# Patient Record
Sex: Male | Born: 1980 | Race: White | Hispanic: No | Marital: Single | Smoking: Former smoker
Health system: Southern US, Community
[De-identification: ages and names within clinical notes are randomized; demographics above are authoritative.]

## PROBLEM LIST (undated history)

## (undated) ENCOUNTER — Ambulatory Visit: Admission: EM | Payer: Self-pay | Source: Home / Self Care

## (undated) DIAGNOSIS — F419 Anxiety disorder, unspecified: Secondary | ICD-10-CM

## (undated) DIAGNOSIS — F4310 Post-traumatic stress disorder, unspecified: Secondary | ICD-10-CM

## (undated) DIAGNOSIS — F988 Other specified behavioral and emotional disorders with onset usually occurring in childhood and adolescence: Secondary | ICD-10-CM

---
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
## 2004-05-12 ENCOUNTER — Emergency Department: Payer: Self-pay | Admitting: Emergency Medicine

## 2004-05-13 ENCOUNTER — Ambulatory Visit: Payer: Self-pay | Admitting: Family Medicine

## 2004-05-15 ENCOUNTER — Ambulatory Visit: Payer: Self-pay | Admitting: Family Medicine

## 2005-10-04 ENCOUNTER — Emergency Department: Payer: Self-pay | Admitting: Emergency Medicine

## 2005-10-28 ENCOUNTER — Ambulatory Visit: Payer: Self-pay | Admitting: Internal Medicine

## 2005-11-11 ENCOUNTER — Ambulatory Visit: Payer: Self-pay | Admitting: Family Medicine

## 2005-11-25 ENCOUNTER — Ambulatory Visit: Payer: Self-pay | Admitting: Family Medicine

## 2005-12-21 ENCOUNTER — Ambulatory Visit: Payer: Self-pay | Admitting: Family Medicine

## 2005-12-31 ENCOUNTER — Ambulatory Visit: Payer: Self-pay | Admitting: Family Medicine

## 2006-01-12 ENCOUNTER — Ambulatory Visit: Payer: Self-pay | Admitting: Family Medicine

## 2006-01-18 ENCOUNTER — Ambulatory Visit: Payer: Self-pay | Admitting: Family Medicine

## 2006-07-22 ENCOUNTER — Ambulatory Visit: Payer: Self-pay | Admitting: Family Medicine

## 2006-08-11 DIAGNOSIS — L259 Unspecified contact dermatitis, unspecified cause: Secondary | ICD-10-CM | POA: Insufficient documentation

## 2009-08-11 ENCOUNTER — Ambulatory Visit: Payer: Self-pay | Admitting: Family Medicine

## 2009-08-11 DIAGNOSIS — G4700 Insomnia, unspecified: Secondary | ICD-10-CM | POA: Insufficient documentation

## 2009-08-11 DIAGNOSIS — F1121 Opioid dependence, in remission: Secondary | ICD-10-CM | POA: Insufficient documentation

## 2009-12-11 ENCOUNTER — Encounter (INDEPENDENT_AMBULATORY_CARE_PROVIDER_SITE_OTHER): Payer: Self-pay | Admitting: *Deleted

## 2010-06-10 NOTE — Letter (Signed)
Summary: Nadara Eaton letter  [place] [facility]  [address] [place], [place] [address]   Phone: [phone]  Fax: [phone]       12/11/2009 MRN: [id number]  KYSER WANDEL [address] [place], [place]  [address]  Dear Mr. [facility] [place], and [place] announce the retirement of Arta Silence, M.D., from full-time practice at the [facility] office effective November 06, 2009 and his plans of returning part-time.  It is important to Dr. Hetty Ely and to our practice that you understand that [facility] has seven physicians in our office for your health care needs.  We will continue to offer the same exceptional care that you have today.    Dr. Hetty Ely has spoken to many of you about his plans for retirement and returning part-time in the fall.   We will continue to work with you through the transition to schedule appointments for you in the office and meet the high standards that [place] is committed to.   Again, it is with great pleasure that we share the news that Dr. Hetty Ely will return to [facility] in October of 2011 with a reduced schedule.    If you have any questions, or would like to request an appointment with one of our physicians, please call us at [phone] and press the option for Scheduling an appointment.  We take pleasure in providing you with excellent patient care and look forward to seeing you at your next office visit.  Our [facility] Physicians are:  Tillman Abide, M.D. Laurita Quint, M.D. Roxy Manns, M.D. Kerby Nora, M.D. Hannah Beat, M.D. Ruthe Mannan, M.D. We proudly welcomed Raechel Ache, M.D. and Eustaquio Boyden, M.D. to the practice in July/August 2011.  Sincerely,  [facility]

## 2010-06-10 NOTE — Assessment & Plan Note (Signed)
Summary: PHYSICAL/CHECK-UP   Vital Signs:  Patient profile:   30 year old male Height:      69.5 inches Weight:      182.50 pounds BMI:     26.66 Temp:     98.1 degrees F oral Pulse rate:   60 / minute Pulse rhythm:   regular BP sitting:   108 / 68  (left arm) Cuff size:   regular  Vitals Entered By: Sydell Axon LPN (August 12, 979 10:53 AM) CC: 30 Minute checkup   History of Present Illness: Pt here for Comp Exam. He is having trouble sleeping for the last 2-3 mos, wakes up a lot. It culminates in awakening in AM at 7 AM and typically has to get up even on the weekends when he would prefer to sleep later.  He admits to pressure  His best friend died 19-Jan-2024and was his roommate. He lost his home at school as well when his frien died and school was to start 2 days later and has put him behind the eightball.  He thinks he had some problem before with sleep but this situation has magnified the problem.Marland Kitchen He gets up to urinate...typically between 3 and 5 AM and then again around 7 AM.  He gets off work 5-6 PM, dead tired. If he waits until 9-10 to go to bed, he has trouble getting to sleep because he is wired. He is in trmt for drug/medication abuse, using Suboxone. He is supposed to be taking 2 1/2 a day but is only taking 1 a day in AM. He was initially told trmt would be one year but the [facility] gets people off in one month. He had 2 friends die in the middle of his trmt (one mentioned above) and this set him back some.  He is to get therapy through [facility] center to the [facility] for evaluation. His problem started with Oxycontin use and eventually escalated to IV drugs. He finally couldn't afford his habit and realized he needed help. He has been fairly consistent with trmt for almost a year with a lapse in January and has been drugfree since then. He is otherwise reasonably healthy.  Preventive Screening-Counseling & Management  Alcohol-Tobacco     Alcohol drinks/day: <1     Alcohol type: Very rare mixed drink     Smoking Status: quit  Caffeine-Diet-Exercise     Caffeine use/day: soda during the day.      Does Patient Exercise: no  Problems Prior to Update: 1)  Acne Vulgaris, Hx of  (ICD-V15.89) 2)  Dermatitis  (ICD-692.9)  Medications Prior to Update: 1)  None  Past History:  Risk Factors: Alcohol Use: <1 (08/11/2009) Caffeine Use: soda during the day.  (08/11/2009) Exercise: no (08/11/2009)  Risk Factors: Smoking Status: quit (08/11/2009)  Family History: Father A 53 HTN Mild obesity Mother A 69 Brother A 25  Social History: Lives in [place] goes [employer], studying BusinessCaffeine use/day:  soda during the day.  Does Patient Exercise:  no  Review of Systems General:  Complains of chills and fatigue; denies fever, sweats, weakness, and weight loss; has chills and hot flashes and fatigue for quite a while, felt to be from Suboxone. Eyes:  Denies blurring, discharge, and eye pain. ENT:  Denies decreased hearing, earache, ringing in ears, and sinus pressure. CV:  Denies chest pain or discomfort, fainting, palpitations, shortness of breath with exertion, and swelling of feet. Resp:  Denies cough, shortness of  breath, and wheezing. GI:  Denies abdominal pain, bloody stools, change in bowel habits, constipation, dark tarry stools, diarrhea, indigestion, loss of appetite, nausea, vomiting, vomiting blood, and yellowish skin color. GU:  Complains of nocturia and urinary frequency; denies discharge, dysuria, incontinence, and urinary hesitancy. MS:  Denies joint pain, low back pain, muscle aches, and stiffness. Derm:  Denies dryness, itching, and rash; mild acne. Neuro:  Denies numbness, poor balance, tingling, and tremors.  Physical Exam  General:  Well-developed,well-nourished,in no acute distress; alert,appropriate and cooperative throughout examination. Head:  Normocephalic and atraumatic without obvious abnormalities. No apparent alopecia or  balding. SInuses NT. Eyes:  Conjunctiva clear bilaterally.  Ears:  External ear exam shows no significant lesions or deformities.  Otoscopic examination reveals clear canals, tympanic membranes are intact bilaterally without bulging, retraction, inflammation or discharge. Hearing is grossly normal bilaterally. Nose:  External nasal examination shows no deformity or inflammation. Nasal mucosa are pink and moist without lesions or exudates. Mouth:  Oral mucosa and oropharynx without lesions or exudates.  Teeth in good repair. Neck:  No deformities, masses, or tenderness noted. Chest Wall:  No deformities, masses, tenderness or gynecomastia noted. Breasts:  No masses or gynecomastia noted Lungs:  Normal respiratory effort, chest expands symmetrically. Lungs are clear to auscultation, no crackles or wheezes. Heart:  Normal rate and regular rhythm. S1 and S2 normal without gallop, murmur, click, rub or other extra sounds. Abdomen:  Bowel sounds positive,abdomen soft and non-tender without masses, organomegaly or hernias noted. Rectal:  Not done. Genitalia:  Not done. Prostate:  Not done. Msk:  No deformity or scoliosis noted of thoracic or lumbar spine.   Pulses:  R and L carotid,radial,femoral,dorsalis pedis and posterior tibial pulses are full and equal bilaterally Extremities:  No clubbing, cyanosis, edema, or deformity noted with normal full range of motion of all joints.   Neurologic:  No cranial nerve deficits noted. Station and gait are normal. Sensory, motor and coordinative functions appear intact. Skin:  Intact without suspicious lesions or rashes, minimal acne of the face and shoulders. Cervical Nodes:  No lymphadenopathy noted Inguinal Nodes:  No significant adenopathy Psych:  Cognition and judgment appear intact. Alert and cooperative with normal attention span and concentration. No apparent delusions, illusions, hallucinations   Impression & Recommendations:  Problem # 1:  HEALTH  MAINTENANCE EXAM (ICD-V70.0) Assessment Comment Only  Tdap today. Discussed healthy lifestyle and regular exercise, which he is not doing. Will check Chol in the future.  Reviewed preventive care protocols, scheduled due services, and updated immunizations.  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 Continue with counselling and discuss insomnia with counselor.   Problem # 3:  ACNE VULGARIS, HX OF (ICD-V15.89) Assessment: Improved Rash improved. Cont as is.  Problem # 4:  INSOMNIA (ICD-780.52)  Discussed sleep hygiene. Read article to him. Discussed at length. Institute good behaviors. Do not think this is urination problem. Probably thinks so due to waking up and then realinzing he must urinate. Avoid sedatives. Remove TV from bedroom. Do not eat or read there as well.  Complete Medication List: 1)  Multivitamins Caps (Multiple vitamin) .... Take one three times a week 2)  Aleve 220 Mg Tabs (Naproxen sodium) .... As needed  Other Orders: Tdap => 44yrs IM (60454) Admin 1st Vaccine (09811)  Patient Instructions: 1)  RTC as needed. Discuss insomnia with counselor.     Tetanus/Td Vaccine    Vaccine Type: Tdap  Site: left deltoid    Mfr: GlaxoSmithKline    Dose: 0.5 ml    Route: IM    Given by: Sydell Axon LPN    Exp. Date: 07/05/2011    Lot #: [id number]    VIS given: 03/28/07 version given August 11, 2009.

## 2010-07-25 ENCOUNTER — Emergency Department: Payer: Self-pay | Admitting: Emergency Medicine

## 2010-07-25 ENCOUNTER — Emergency Department: Payer: Self-pay | Admitting: Unknown Physician Specialty

## 2010-07-28 ENCOUNTER — Inpatient Hospital Stay: Payer: Self-pay | Admitting: Psychiatry

## 2010-09-12 ENCOUNTER — Emergency Department: Payer: Self-pay | Admitting: Unknown Physician Specialty

## 2010-12-21 ENCOUNTER — Emergency Department (HOSPITAL_COMMUNITY)
Admission: EM | Admit: 2010-12-21 | Discharge: 2010-12-21 | Disposition: A | Discharge status: Home / Self Care | Payer: Self-pay | Attending: Emergency Medicine | Admitting: Emergency Medicine

## 2010-12-21 ENCOUNTER — Emergency Department (HOSPITAL_COMMUNITY)
Admission: EM | Admit: 2010-12-21 | Discharge: 2010-12-21 | Discharge status: Against Medical Advice | Payer: Self-pay | Attending: Emergency Medicine | Admitting: Emergency Medicine

## 2010-12-21 ENCOUNTER — Emergency Department (HOSPITAL_COMMUNITY): Payer: Self-pay

## 2010-12-21 DIAGNOSIS — R112 Nausea with vomiting, unspecified: Secondary | ICD-10-CM | POA: Insufficient documentation

## 2010-12-21 DIAGNOSIS — F1120 Opioid dependence, uncomplicated: Secondary | ICD-10-CM | POA: Insufficient documentation

## 2010-12-21 DIAGNOSIS — R61 Generalized hyperhidrosis: Secondary | ICD-10-CM | POA: Insufficient documentation

## 2010-12-21 DIAGNOSIS — R0602 Shortness of breath: Secondary | ICD-10-CM | POA: Insufficient documentation

## 2010-12-21 DIAGNOSIS — R6883 Chills (without fever): Secondary | ICD-10-CM | POA: Insufficient documentation

## 2010-12-21 DIAGNOSIS — F19939 Other psychoactive substance use, unspecified with withdrawal, unspecified: Secondary | ICD-10-CM | POA: Insufficient documentation

## 2010-12-21 LAB — DIFFERENTIAL
Basophils Relative: 1 % (ref 0–1)
Eosinophils Absolute: 0.1 10*3/uL (ref 0.0–0.7)
Lymphs Abs: 3.5 10*3/uL (ref 0.7–4.0)
Monocytes Absolute: 0.6 10*3/uL (ref 0.1–1.0)
Monocytes Relative: 9 % (ref 3–12)
Neutro Abs: 2.7 10*3/uL (ref 1.7–7.7)
Neutrophils Relative %: 39 % — ABNORMAL LOW (ref 43–77)

## 2010-12-21 LAB — CBC
Hemoglobin: 16.9 g/dL (ref 13.0–17.0)
MCH: 31.1 pg (ref 26.0–34.0)
MCHC: 36.7 g/dL — ABNORMAL HIGH (ref 30.0–36.0)
MCV: 84.7 fL (ref 78.0–100.0)
RBC: 5.44 MIL/uL (ref 4.22–5.81)

## 2010-12-21 LAB — POCT I-STAT, CHEM 8
BUN: 15 mg/dL (ref 6–23)
Calcium, Ion: 1.11 mmol/L — ABNORMAL LOW (ref 1.12–1.32)
Chloride: 102 mEq/L (ref 96–112)
Creatinine, Ser: 1.2 mg/dL (ref 0.50–1.35)
TCO2: 26 mmol/L (ref 0–100)

## 2010-12-21 LAB — CK: Total CK: 98 U/L (ref 7–232)

## 2010-12-21 LAB — RAPID URINE DRUG SCREEN, HOSP PERFORMED
Barbiturates: NOT DETECTED
Benzodiazepines: NOT DETECTED
Cocaine: NOT DETECTED
Tetrahydrocannabinol: NOT DETECTED

## 2010-12-21 MED ORDER — IOHEXOL 300 MG/ML  SOLN
100.0000 mL | Freq: Once | INTRAMUSCULAR | Status: AC | PRN
  Administered 2010-12-21: 80 mL via INTRAVENOUS

## 2011-10-08 ENCOUNTER — Emergency Department: Payer: Self-pay | Admitting: Emergency Medicine

## 2011-10-08 LAB — COMPREHENSIVE METABOLIC PANEL
Albumin: 4.1 g/dL (ref 3.4–5.0)
Anion Gap: 11 (ref 7–16)
BUN: 16 mg/dL (ref 7–18)
Bilirubin,Total: 0.6 mg/dL (ref 0.2–1.0)
Chloride: 105 mmol/L (ref 98–107)
Co2: 26 mmol/L (ref 21–32)
Creatinine: 1.22 mg/dL (ref 0.60–1.30)
EGFR (African American): >60
EGFR (Non-African Amer.): >60
Glucose: 102 mg/dL — ABNORMAL HIGH (ref 65–99)
Osmolality: 285 (ref 275–301)
Sodium: 142 mmol/L (ref 136–145)
Total Protein: 7.6 g/dL (ref 6.4–8.2)

## 2011-10-08 LAB — DRUG SCREEN, URINE
Barbiturates, Ur Screen: NEGATIVE (ref ?–200)
Benzodiazepine, Ur Scrn: POSITIVE (ref ?–200)
Cannabinoid 50 Ng, Ur ~~LOC~~: POSITIVE (ref ?–50)

## 2011-10-08 LAB — URINALYSIS, COMPLETE
Bilirubin,UR: NEGATIVE
Glucose,UR: NEGATIVE mg/dL (ref 0–75)
Leukocyte Esterase: NEGATIVE
Ph: 5 (ref 4.5–8.0)
Protein: 100
RBC,UR: 2 /HPF (ref 0–5)
Squamous Epithelial: NONE SEEN
WBC UR: 5 /HPF (ref 0–5)

## 2011-10-08 LAB — CBC
HCT: 45.7 % (ref 40.0–52.0)
HGB: 15.5 g/dL (ref 13.0–18.0)
MCH: 30.3 pg (ref 26.0–34.0)
MCHC: 33.9 g/dL (ref 32.0–36.0)
MCV: 89 fL (ref 80–100)
Platelet: 204 10*3/uL (ref 150–440)
RBC: 5.12 10*6/uL (ref 4.40–5.90)
RDW: 13.5 % (ref 11.5–14.5)

## 2011-10-28 ENCOUNTER — Encounter: Payer: Self-pay | Admitting: Family Medicine

## 2011-10-29 ENCOUNTER — Ambulatory Visit: Payer: Self-pay | Admitting: Family Medicine

## 2011-10-29 DIAGNOSIS — Z0289 Encounter for other administrative examinations: Secondary | ICD-10-CM

## 2013-06-14 ENCOUNTER — Emergency Department: Payer: Self-pay | Admitting: Emergency Medicine

## 2013-06-14 LAB — COMPREHENSIVE METABOLIC PANEL
ALBUMIN: 3.9 g/dL (ref 3.4–5.0)
ALK PHOS: 58 U/L
Anion Gap: 3 — ABNORMAL LOW (ref 7–16)
BILIRUBIN TOTAL: 0.6 mg/dL (ref 0.2–1.0)
BUN: 17 mg/dL (ref 7–18)
CALCIUM: 9.2 mg/dL (ref 8.5–10.1)
CHLORIDE: 106 mmol/L (ref 98–107)
CO2: 31 mmol/L (ref 21–32)
CREATININE: 1.16 mg/dL (ref 0.60–1.30)
EGFR (African American): >60
Glucose: 119 mg/dL — ABNORMAL HIGH (ref 65–99)
Osmolality: 282 (ref 275–301)
Potassium: 3.9 mmol/L (ref 3.5–5.1)
SGOT(AST): 164 U/L — ABNORMAL HIGH (ref 15–37)
SGPT (ALT): 383 U/L — ABNORMAL HIGH (ref 12–78)
SODIUM: 140 mmol/L (ref 136–145)
TOTAL PROTEIN: 7.5 g/dL (ref 6.4–8.2)

## 2013-06-14 LAB — DRUG SCREEN, URINE
AMPHETAMINES, UR SCREEN: NEGATIVE (ref ?–1000)
BENZODIAZEPINE, UR SCRN: NEGATIVE (ref ?–200)
Barbiturates, Ur Screen: NEGATIVE (ref ?–200)
Cannabinoid 50 Ng, Ur ~~LOC~~: POSITIVE (ref ?–50)
Cocaine Metabolite,Ur ~~LOC~~: POSITIVE (ref ?–300)
MDMA (ECSTASY) UR SCREEN: NEGATIVE (ref ?–500)
METHADONE, UR SCREEN: NEGATIVE (ref ?–300)
OPIATE, UR SCREEN: POSITIVE (ref ?–300)
PHENCYCLIDINE (PCP) UR S: NEGATIVE (ref ?–25)
TRICYCLIC, UR SCREEN: NEGATIVE (ref ?–1000)

## 2013-06-14 LAB — URINALYSIS, COMPLETE
BACTERIA: NONE SEEN
BILIRUBIN, UR: NEGATIVE
Blood: NEGATIVE
Glucose,UR: NEGATIVE mg/dL (ref 0–75)
Ketone: NEGATIVE
LEUKOCYTE ESTERASE: NEGATIVE
NITRITE: NEGATIVE
PROTEIN: NEGATIVE
Ph: 5 (ref 4.5–8.0)
RBC,UR: NONE SEEN /HPF (ref 0–5)
SPECIFIC GRAVITY: 1.026 (ref 1.003–1.030)
Squamous Epithelial: NONE SEEN
WBC UR: 1 /HPF (ref 0–5)

## 2013-06-14 LAB — CBC
HCT: 43.4 % (ref 40.0–52.0)
HGB: 15 g/dL (ref 13.0–18.0)
MCH: 31.6 pg (ref 26.0–34.0)
MCHC: 34.6 g/dL (ref 32.0–36.0)
MCV: 92 fL (ref 80–100)
PLATELETS: 169 10*3/uL (ref 150–440)
RBC: 4.74 10*6/uL (ref 4.40–5.90)
RDW: 12.9 % (ref 11.5–14.5)
WBC: 5.7 10*3/uL (ref 3.8–10.6)

## 2013-06-14 LAB — ACETAMINOPHEN LEVEL: Acetaminophen: <2 ug/mL

## 2013-06-14 LAB — ETHANOL
Ethanol %: <0.003 % (ref 0.000–0.080)
Ethanol: <3 mg/dL

## 2013-06-14 LAB — SALICYLATE LEVEL: Salicylates, Serum: 2 mg/dL

## 2013-07-14 ENCOUNTER — Emergency Department: Payer: Self-pay | Admitting: Internal Medicine

## 2013-07-14 LAB — URINALYSIS, COMPLETE
BACTERIA: NONE SEEN
BILIRUBIN, UR: NEGATIVE
Blood: NEGATIVE
GLUCOSE, UR: NEGATIVE mg/dL (ref 0–75)
LEUKOCYTE ESTERASE: NEGATIVE
Nitrite: NEGATIVE
Ph: 5 (ref 4.5–8.0)
Protein: 100
Specific Gravity: 1.033 (ref 1.003–1.030)
Squamous Epithelial: 1

## 2013-07-14 LAB — CBC
HCT: 44.6 % (ref 40.0–52.0)
HGB: 15.8 g/dL (ref 13.0–18.0)
MCH: 32 pg (ref 26.0–34.0)
MCHC: 35.5 g/dL (ref 32.0–36.0)
MCV: 90 fL (ref 80–100)
PLATELETS: 207 10*3/uL (ref 150–440)
RBC: 4.95 10*6/uL (ref 4.40–5.90)
RDW: 13.2 % (ref 11.5–14.5)
WBC: 7.2 10*3/uL (ref 3.8–10.6)

## 2013-07-14 LAB — DRUG SCREEN, URINE
Amphetamines, Ur Screen: NEGATIVE (ref ?–1000)
BARBITURATES, UR SCREEN: NEGATIVE (ref ?–200)
Benzodiazepine, Ur Scrn: POSITIVE (ref ?–200)
CANNABINOID 50 NG, UR ~~LOC~~: POSITIVE (ref ?–50)
COCAINE METABOLITE, UR ~~LOC~~: POSITIVE (ref ?–300)
MDMA (ECSTASY) UR SCREEN: NEGATIVE (ref ?–500)
METHADONE, UR SCREEN: NEGATIVE (ref ?–300)
OPIATE, UR SCREEN: POSITIVE (ref ?–300)
PHENCYCLIDINE (PCP) UR S: NEGATIVE (ref ?–25)
TRICYCLIC, UR SCREEN: NEGATIVE (ref ?–1000)

## 2013-07-14 LAB — COMPREHENSIVE METABOLIC PANEL
ALBUMIN: 4.8 g/dL (ref 3.4–5.0)
ALK PHOS: 71 U/L
ANION GAP: 0 — AB (ref 7–16)
AST: 205 U/L — AB (ref 15–37)
BILIRUBIN TOTAL: 0.8 mg/dL (ref 0.2–1.0)
BUN: 18 mg/dL (ref 7–18)
CALCIUM: 9.4 mg/dL (ref 8.5–10.1)
CHLORIDE: 101 mmol/L (ref 98–107)
CO2: 35 mmol/L — AB (ref 21–32)
Creatinine: 1.31 mg/dL — ABNORMAL HIGH (ref 0.60–1.30)
EGFR (African American): >60
EGFR (Non-African Amer.): >60
Glucose: 86 mg/dL (ref 65–99)
Osmolality: 273 (ref 275–301)
Potassium: 3.5 mmol/L (ref 3.5–5.1)
SGPT (ALT): 548 U/L — ABNORMAL HIGH (ref 12–78)
SODIUM: 136 mmol/L (ref 136–145)
TOTAL PROTEIN: 9.1 g/dL — AB (ref 6.4–8.2)

## 2013-07-14 LAB — ETHANOL

## 2013-07-14 LAB — SALICYLATE LEVEL: Salicylates, Serum: <1.7 mg/dL

## 2013-07-14 LAB — ACETAMINOPHEN LEVEL

## 2013-08-07 ENCOUNTER — Emergency Department: Payer: Self-pay | Admitting: Emergency Medicine

## 2013-09-17 ENCOUNTER — Ambulatory Visit: Payer: Self-pay | Admitting: Family Medicine

## 2013-09-17 DIAGNOSIS — Z0289 Encounter for other administrative examinations: Secondary | ICD-10-CM

## 2013-10-04 ENCOUNTER — Ambulatory Visit: Payer: Self-pay | Admitting: Family Medicine

## 2013-10-04 DIAGNOSIS — Z0289 Encounter for other administrative examinations: Secondary | ICD-10-CM

## 2014-01-27 ENCOUNTER — Emergency Department: Payer: Self-pay | Admitting: Emergency Medicine

## 2014-01-27 LAB — CBC
HCT: 47 % (ref 40.0–52.0)
HGB: 15.5 g/dL (ref 13.0–18.0)
MCH: 30.4 pg (ref 26.0–34.0)
MCHC: 33 g/dL (ref 32.0–36.0)
MCV: 92 fL (ref 80–100)
Platelet: 230 10*3/uL (ref 150–440)
RBC: 5.1 10*6/uL (ref 4.40–5.90)
RDW: 13.2 % (ref 11.5–14.5)
WBC: 4.5 10*3/uL (ref 3.8–10.6)

## 2014-01-27 LAB — COMPREHENSIVE METABOLIC PANEL
ALBUMIN: 4.1 g/dL (ref 3.4–5.0)
ALK PHOS: 75 U/L
ANION GAP: 10 (ref 7–16)
BILIRUBIN TOTAL: 0.7 mg/dL (ref 0.2–1.0)
BUN: 21 mg/dL — AB (ref 7–18)
CALCIUM: 8.8 mg/dL (ref 8.5–10.1)
CHLORIDE: 104 mmol/L (ref 98–107)
CO2: 26 mmol/L (ref 21–32)
Creatinine: 1.36 mg/dL — ABNORMAL HIGH (ref 0.60–1.30)
Glucose: 110 mg/dL — ABNORMAL HIGH (ref 65–99)
Osmolality: 283 (ref 275–301)
Potassium: 3.6 mmol/L (ref 3.5–5.1)
SGOT(AST): 291 U/L — ABNORMAL HIGH (ref 15–37)
SGPT (ALT): 515 U/L — ABNORMAL HIGH
SODIUM: 140 mmol/L (ref 136–145)
TOTAL PROTEIN: 8.6 g/dL — AB (ref 6.4–8.2)

## 2014-01-27 LAB — DRUG SCREEN, URINE
AMPHETAMINES, UR SCREEN: POSITIVE (ref ?–1000)
Barbiturates, Ur Screen: NEGATIVE (ref ?–200)
Benzodiazepine, Ur Scrn: POSITIVE (ref ?–200)
CANNABINOID 50 NG, UR ~~LOC~~: POSITIVE (ref ?–50)
COCAINE METABOLITE, UR ~~LOC~~: POSITIVE (ref ?–300)
MDMA (ECSTASY) UR SCREEN: POSITIVE (ref ?–500)
METHADONE, UR SCREEN: NEGATIVE (ref ?–300)
OPIATE, UR SCREEN: POSITIVE (ref ?–300)
PHENCYCLIDINE (PCP) UR S: NEGATIVE (ref ?–25)
Tricyclic, Ur Screen: NEGATIVE (ref ?–1000)

## 2014-01-27 LAB — URINALYSIS, COMPLETE
BILIRUBIN, UR: NEGATIVE
Blood: NEGATIVE
GLUCOSE, UR: NEGATIVE mg/dL (ref 0–75)
KETONE: NEGATIVE
LEUKOCYTE ESTERASE: NEGATIVE
NITRITE: NEGATIVE
Ph: 5 (ref 4.5–8.0)
Protein: 25
Specific Gravity: 1.025 (ref 1.003–1.030)
Squamous Epithelial: NONE SEEN

## 2014-01-27 LAB — SALICYLATE LEVEL: Salicylates, Serum: <1.7 mg/dL

## 2014-01-27 LAB — ACETAMINOPHEN LEVEL

## 2014-01-27 LAB — ETHANOL: Ethanol: <3 mg/dL

## 2014-08-31 NOTE — Consult Note (Signed)
PATIENT NAME:  Bruce Serrano, Shontez MR#:  [id number] DATE OF BIRTH:  Sep 15, 1980  DATE OF CONSULTATION:  07/15/2013  REFERRING PHYSICIAN:   CONSULTING PHYSICIAN:  Amani Marseille K. Guss Bundehalla, MD  DATE OF DICTATION: 07/15/2013   [facility]   AGE: 34 years   SEX: Male   RACE: White   SUBJECTIVE: [facility]. Please go back to the evaluation done by the undersigned on 07/14/2013. Staff reports that the patient was referred to go to [facility] for his substance abuse problem because he has been abusing Opana, cocaine, THC and benzos from the street. The patient refuses to go to any such program, and he demands that he get   benzos for his anxiety.   OBJECTIVE: The patient is seen relaxing in the bed with hospital scrubs. Alert and oriented. Calm and cooperative. No agitation. Affect is neutral, and mood appears to be stable. Does not appear to be responding to internal stimuli. Very manipulative and demanding in what he wants, and he reports that he had been on Xanax in the past and was [facility] Dr. Janeece RiggersSu. Denies any ideas or plans to hurt himself or others and wants to get benzos at this time. Insight and judgment guarded.   IMPRESSION: Polysubstance abuse-dependence with Opana, cocaine, THC and benzodiazepines from the street.   RECOMMEND: Discontinue IVC. The patient is to be discharged, and he will go back home to visit his girlfriend and other family members. [facility]ervyn Ske[facility] and [facility] Janeece RiggersSu as he had been going to these agencies in the past, and he is aware of the same.   ____________________________ Jannet MantisSurya K. Guss Bundehalla, MD skc:gb D: 07/15/2013 19:29:17 ET T: 07/16/2013 02:24:55 ET JOB#: [id number]  cc: Monika SalkSurya K. Guss Bundehalla, MD, <Dictator> Beau FannySURYA K Adriell Polansky MD ELECTRONICALLY SIGNED 07/18/2013 15:24
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
## 2016-11-14 ENCOUNTER — Encounter: Payer: Self-pay | Admitting: Medical Oncology

## 2016-11-14 ENCOUNTER — Emergency Department
Admission: EM | Admit: 2016-11-14 | Discharge: 2016-11-14 | Disposition: A | Discharge status: Home / Self Care | Payer: Self-pay | Attending: Emergency Medicine | Admitting: Emergency Medicine

## 2016-11-14 DIAGNOSIS — Y999 Unspecified external cause status: Secondary | ICD-10-CM | POA: Insufficient documentation

## 2016-11-14 DIAGNOSIS — W260XXA Contact with knife, initial encounter: Secondary | ICD-10-CM | POA: Insufficient documentation

## 2016-11-14 DIAGNOSIS — S61211A Laceration without foreign body of left index finger without damage to nail, initial encounter: Secondary | ICD-10-CM | POA: Insufficient documentation

## 2016-11-14 DIAGNOSIS — Z23 Encounter for immunization: Secondary | ICD-10-CM | POA: Insufficient documentation

## 2016-11-14 DIAGNOSIS — Y939 Activity, unspecified: Secondary | ICD-10-CM | POA: Insufficient documentation

## 2016-11-14 DIAGNOSIS — Z79899 Other long term (current) drug therapy: Secondary | ICD-10-CM | POA: Insufficient documentation

## 2016-11-14 DIAGNOSIS — Y929 Unspecified place or not applicable: Secondary | ICD-10-CM | POA: Insufficient documentation

## 2016-11-14 MED ORDER — LIDOCAINE HCL (PF) 1 % IJ SOLN
10.0000 mL | Freq: Once | INTRAMUSCULAR | Status: AC
  Administered 2016-11-14: 10 mL
  Filled 2016-11-14: qty 10

## 2016-11-14 MED ORDER — CEPHALEXIN 500 MG PO CAPS: 500.0000 mg | ORAL_CAPSULE | Freq: Three times a day (TID) | ORAL | 0 refills | Status: AC

## 2016-11-14 MED ORDER — TETANUS-DIPHTH-ACELL PERTUSSIS 5-2.5-18.5 LF-MCG/0.5 IM SUSP
0.5000 mL | Freq: Once | INTRAMUSCULAR | Status: AC
  Administered 2016-11-14: 0.5 mL via INTRAMUSCULAR
  Filled 2016-11-14: qty 0.5

## 2016-11-14 NOTE — ED Provider Notes (Signed)
[facility] Emergency Department Provider Note  ____________________________________________  Time seen: Approximately 6:21 PM  I have reviewed the triage vital signs and the nursing notes.   HISTORY  Chief Complaint Laceration    HPI Bruce Serrano is a 36 y.o. male who presents to emergency department complaining of a laceration to the second digit of his left hand. Patient reports that he was using a knife when the blade slipped and lacerated the dorsal aspect of the second digit left hand. This is over the DIP joint. Patient reports good range of motion to the finger. He was able to control bleeding with direct pressure. Patient's tetanus shot is out of date. No other injury or complaint.   History reviewed. No pertinent past medical history.  Patient Active Problem List   Diagnosis Date Noted  . OPIOID TYPE DEPENDENCE IN REMISSION 08/11/2009  . INSOMNIA 08/11/2009  . DERMATITIS 08/11/2006  . ACNE VULGARIS, HX OF 04/11/1995    No past surgical history on file.  Prior to Admission medications   Medication Sig Start Date End Date Taking? Authorizing Provider  cephALEXin (KEFLEX) 500 MG capsule Take 1 capsule (500 mg total) by mouth 3 (three) times daily. 11/14/16   Cuthriell, Delorise RoyalsJonathan D, PA-C  Multiple Vitamin (MULTIVITAMIN) tablet Take 1 tablet by mouth daily.    [provider]  naproxen sodium (ANAPROX) 220 MG tablet Take 220 mg by mouth 2 (two) times daily with a meal.    [provider]    Allergies Patient has no allergy information on record.  Family History  Problem Relation Age of Onset  . Hypertension Father   . Obesity Father     Social History Social History  Substance Use Topics  . Smoking status: Not on file  . Smokeless tobacco: Not on file  . Alcohol use Not on file     Review of Systems  Constitutional: No fever/chills Eyes: No visual changes.  Cardiovascular: no chest pain. Respiratory: no cough.  No SOB. Gastrointestinal: No abdominal pain.  No nausea, no vomiting.  Musculoskeletal: Negative for musculoskeletal pain. Skin: Positive for laceration to the second digit of left hand. Neurological: Negative for headaches, focal weakness or numbness. 10-point ROS otherwise negative.  ____________________________________________   PHYSICAL EXAM:  VITAL SIGNS: ED Triage Vitals [11/14/16 1702]  Enc Vitals Group     BP 125/74     Pulse Rate 93     Resp 18     Temp 98.5 F (36.9 C)     Temp Source Oral     SpO2 97 %     Weight 195 lb (88.5 kg)     Height 5\' 10"  (1.778 m)     Head Circumference      Peak Flow      Pain Score      Pain Loc      Pain Edu?      Excl. in GC?      Constitutional: Alert and oriented. Well appearing and in no acute distress. Eyes: Conjunctivae are normal. PERRL. EOMI. Head: Atraumatic. Neck: No stridor.    Cardiovascular: Normal rate, regular rhythm. Normal S1 and S2.  Good peripheral circulation. Respiratory: Normal respiratory effort without tachypnea or retractions. Lungs CTAB. Good air entry to the bases with no decreased or absent breath sounds. Musculoskeletal: Full range of motion to all extremities. No gross deformities appreciated. Neurologic:  Normal speech and language. No gross focal neurologic deficits are appreciated.  Skin:  Skin is warm,  dry and intact. No rash noted.2.5 cm laceration noted to second digit of left hand. This is overlying the DIP joint on the dorsal aspect of the digit. Full range of motion to the finger. No visible foreign body. Edges are smooth in nature. No bleeding at this time. Cap refill intact. Sensation intact distally. Laceration does not involve nail bed. Psychiatric: Mood and affect are normal. Speech and behavior are normal. Patient exhibits appropriate insight and judgement.   ____________________________________________   LABS (all labs ordered are listed, but only abnormal results are  displayed)  Labs Reviewed - No data to display ____________________________________________  EKG   ____________________________________________  RADIOLOGY   No results found.  ____________________________________________    PROCEDURES  Procedure(s) performed:    Marland KitchenMarland KitchenLaceration Repair Date/Time: 11/14/2016 7:40 PM Performed by: Gala Romney D Authorized by: Gala Romney D   Consent:    Consent obtained:  Verbal   Consent given by:  Patient   Risks discussed:  Pain Anesthesia (see MAR for exact dosages):    Anesthesia method:  Nerve block   Block location:  2nd digit L hand   Block needle gauge:  27 G   Block anesthetic:  Lidocaine 1% w/o epi   Block technique:  Digital block   Block injection procedure:  Anatomic landmarks identified, introduced needle, negative aspiration for blood and incremental injection   Block outcome:  Anesthesia achieved Laceration details:    Location:  Finger   Finger location:  L index finger   Length (cm):  2.5 Repair type:    Repair type:  Simple Pre-procedure details:    Preparation:  Patient was prepped and draped in usual sterile fashion Exploration:    Hemostasis achieved with:  Direct pressure   Wound exploration: wound explored through full range of motion and entire depth of wound probed and visualized     Wound extent: no foreign bodies/material noted, no muscle damage noted, no nerve damage noted, no tendon damage noted and no underlying fracture noted     Contaminated: no   Treatment:    Area cleansed with:  Betadine   Amount of cleaning:  Standard   Irrigation solution:  Sterile saline Skin repair:    Repair method:  Sutures   Suture size:  4-0   Suture material:  Nylon   Suture technique:  Simple interrupted   Number of sutures:  7 Approximation:    Approximation:  Close Post-procedure details:    Dressing:  Splint for protection   Patient tolerance of procedure:  Tolerated well, no immediate  complications       Medications  Tdap (BOOSTRIX) injection 0.5 mL (0.5 mLs Intramuscular Given 11/14/16 1830)  lidocaine (PF) (XYLOCAINE) 1 % injection 10 mL (10 mLs Infiltration Given 11/14/16 1830)     ____________________________________________   INITIAL IMPRESSION / ASSESSMENT AND PLAN / ED COURSE  Pertinent labs & imaging results that were available during my care of the patient were reviewed by me and considered in my medical decision making (see chart for details).  Review of the [place] CSRS was performed in accordance of the [facility] prior to dispensing any controlled drugs.     Patient's diagnosis is consistent with a laceration to the second digit of the left hand. This is closed as described above. The patient's tetanus shot is updated.. Patient will be discharged home with prescriptions for antibiotics prophylactically as laceration crosses over a joint. Patient is to follow up with primary care in 1 week for suture removal or  sooner as needed or otherwise directed. Patient is given ED precautions to return to the ED for any worsening or new symptoms.     ____________________________________________  FINAL CLINICAL IMPRESSION(S) / ED DIAGNOSES  Final diagnoses:  Laceration of left index finger without foreign body without damage to nail, initial encounter      NEW MEDICATIONS STARTED DURING THIS VISIT:  New Prescriptions   CEPHALEXIN (KEFLEX) 500 MG CAPSULE    Take 1 capsule (500 mg total) by mouth 3 (three) times daily.        This chart was dictated using voice recognition software/Dragon. Despite best efforts to proofread, errors can occur which can change the meaning. Any change was purely unintentional.    Racheal Patches, PA-C 11/14/16 1950    Merrily Brittle, MD 11/14/16 [phone]

## 2016-11-14 NOTE — ED Triage Notes (Signed)
Pt reports he cut his index finger on pocket knife. Area wrapped by this RN. Bleeding controlled

## 2020-10-16 ENCOUNTER — Ambulatory Visit
Admission: EM | Admit: 2020-10-16 | Discharge: 2020-10-16 | Disposition: A | Discharge status: Home / Self Care | Payer: Self-pay

## 2020-10-16 ENCOUNTER — Encounter: Payer: Self-pay | Admitting: Emergency Medicine

## 2020-10-16 ENCOUNTER — Other Ambulatory Visit: Payer: Self-pay

## 2020-10-16 DIAGNOSIS — F1110 Opioid abuse, uncomplicated: Secondary | ICD-10-CM | POA: Insufficient documentation

## 2020-10-16 DIAGNOSIS — L237 Allergic contact dermatitis due to plants, except food: Secondary | ICD-10-CM

## 2020-10-16 HISTORY — DX: Anxiety disorder, unspecified: F41.9

## 2020-10-16 HISTORY — DX: Other specified behavioral and emotional disorders with onset usually occurring in childhood and adolescence: F98.8

## 2020-10-16 HISTORY — DX: Post-traumatic stress disorder, unspecified: F43.10

## 2020-10-16 MED ORDER — PREDNISONE 10 MG (21) PO TBPK: ORAL_TABLET | Freq: Every day | ORAL | 0 refills | Status: AC

## 2020-10-16 MED ORDER — METHYLPREDNISOLONE SODIUM SUCC 125 MG IJ SOLR
125.0000 mg | Freq: Once | INTRAMUSCULAR | Status: AC
  Administered 2020-10-16: 125 mg via INTRAMUSCULAR

## 2020-10-16 NOTE — Discharge Instructions (Addendum)
You were given an injection of a steroid called Solu-Medrol.  Start the prednisone taper tomorrow as directed.    Take Benadryl every 6 hours as directed; do not drive, operate machinery, or drink alcohol with this medication as it may cause drowsiness.    Follow up with your primary care provider if your symptoms are not improving.

## 2020-10-16 NOTE — ED Triage Notes (Signed)
Pt presents today with rash to bilateral arms, abdomen, RLE and left eye. He reports working with brush over last few days and believes it might be poison ivy/oak.

## 2020-10-16 NOTE — ED Provider Notes (Signed)
Renaldo Fiddler    CSN: [id number] Arrival date & time: 10/16/20  1248      History   Chief Complaint Chief Complaint  Patient presents with   Rash     HPI BUREL KAHRE is a 40 y.o. male.  Patient presents with pruritic rash on his left eyelid, bilateral arms, and abdomen x 3 days.  He has been working in his yard and believes he has come in contact with poison oak.  Treatment at home with OTC poison ivy lotion.  He denies fever, chills, change in vision, eye pain, mouth sores, or other symptoms.  His medical history includes opioid dependence in remission.  The history is provided by the patient and medical records.   Past Medical History:  Diagnosis Date   ADD (attention deficit disorder)    Anxiety    PTSD (post-traumatic stress disorder)     Patient Active Problem List   Diagnosis Date Noted   Opioid abuse (HCC) 10/16/2020   OPIOID TYPE DEPENDENCE IN REMISSION 08/11/2009   INSOMNIA 08/11/2009   DERMATITIS 08/11/2006   ACNE VULGARIS, HX OF 04/11/1995    History reviewed. No pertinent surgical history.     Home Medications    Prior to Admission medications   Medication Sig Start Date End Date Taking? Authorizing Provider  predniSONE (STERAPRED UNI-PAK 21 TAB) 10 MG (21) TBPK tablet Take by mouth daily. As directed 10/17/20  Yes Mickie Bail, NP  amphetamine-dextroamphetamine (ADDERALL) 20 MG tablet Take 20 mg by mouth 4 (four) times daily. 09/19/20   [provider]  Buprenorphine HCl-Naloxone HCl 2-0.5 MG FILM Place under the tongue.    [provider]  cephALEXin (KEFLEX) 500 MG capsule Take 1 capsule (500 mg total) by mouth 3 (three) times daily. 11/14/16   Cuthriell, Delorise Royals, PA-C  clonazePAM (KLONOPIN) 1 MG tablet Take 1 mg by mouth 3 (three) times daily as needed. 09/16/20   [provider]  Multiple Vitamin (MULTIVITAMIN) tablet Take 1 tablet by mouth daily.    [provider]  naproxen sodium (ANAPROX) 220 MG  tablet Take 220 mg by mouth 2 (two) times daily with a meal.    [provider]  QUEtiapine (SEROQUEL) 100 MG tablet Take 100-200 mg by mouth at bedtime. 06/17/20   [provider]    Family History Family History  Problem Relation Age of Onset   Hypertension Father    Obesity Father     Social History Social History   Tobacco Use   Smoking status: Former    Pack years: 0.00    Types: Cigarettes   Smokeless tobacco: Never  Vaping Use   Vaping Use: Never used  Substance Use Topics   Alcohol use: Not Currently   Drug use: Not Currently     Allergies   Patient has no known allergies.   Review of Systems Review of Systems  Constitutional:  Negative for chills and fever.  Eyes:  Negative for pain, discharge and visual disturbance.  Respiratory:  Negative for cough and shortness of breath.   Cardiovascular:  Negative for chest pain and palpitations.  Gastrointestinal:  Negative for abdominal pain and vomiting.  Skin:  Positive for rash. Negative for color change.  All other systems reviewed and are negative.   Physical Exam Triage Vital Signs ED Triage Vitals  Enc Vitals Group     BP      Pulse      Resp      Temp  Temp src      SpO2      Weight      Height      Head Circumference      Peak Flow      Pain Score      Pain Loc      Pain Edu?      Excl. in GC?    No data found.  Updated Vital Signs BP 119/81 (BP Location: Right Arm)   Pulse 87   Temp 98.6 F (37 C) (Oral)   Resp 18   SpO2 96%   Visual Acuity Right Eye Distance:   Left Eye Distance:   Bilateral Distance:    Right Eye Near:   Left Eye Near:    Bilateral Near:     Physical Exam Vitals and nursing note reviewed.  Constitutional:      General: He is not in acute distress.    Appearance: He is well-developed. He is not ill-appearing.  HENT:     Head: Normocephalic and atraumatic.     Mouth/Throat:     Mouth: Mucous membranes are moist.     Pharynx:  Oropharynx is clear.  Eyes:     Conjunctiva/sclera: Conjunctivae normal.  Cardiovascular:     Rate and Rhythm: Normal rate and regular rhythm.     Heart sounds: Normal heart sounds.  Pulmonary:     Effort: Pulmonary effort is normal. No respiratory distress.     Breath sounds: Normal breath sounds.  Abdominal:     Palpations: Abdomen is soft.     Tenderness: There is no abdominal tenderness.  Musculoskeletal:     Cervical back: Neck supple.  Skin:    General: Skin is warm and dry.     Findings: Rash present.     Comments: Rash on arms, left upper eyelid, abdomen, and chest.  See pictures for details.    Neurological:     General: No focal deficit present.     Mental Status: He is alert and oriented to person, place, and time.     Gait: Gait normal.  Psychiatric:        Mood and Affect: Mood normal.        Behavior: Behavior normal.          UC Treatments / Results  Labs (all labs ordered are listed, but only abnormal results are displayed) Labs Reviewed - No data to display  EKG   Radiology No results found.  Procedures Procedures (including critical care time)  Medications Ordered in UC Medications  methylPREDNISolone sodium succinate (SOLU-MEDROL) 125 mg/2 mL injection 125 mg (has no administration in time range)    Initial Impression / Assessment and Plan / UC Course  I have reviewed the triage vital signs and the nursing notes.  Pertinent labs & imaging results that were available during my care of the patient were reviewed by me and considered in my medical decision making (see chart for details).  Poison oak dermatitis.  Solu-Medrol given here; starting prednisone taper tomorrow.  Instructed patient to take Benadryl every 6 hours.  Precautions for drowsiness with Benadryl discussed.  Instructed patient to follow-up with his PCP  if his symptoms are not improving.  He agrees to plan of care.   Final Clinical Impressions(s) / UC Diagnoses   Final  diagnoses:  Allergic dermatitis due to poison oak     Discharge Instructions      You were given an injection of a steroid called Solu-Medrol.  Start  the prednisone taper tomorrow as directed.    Take Benadryl every 6 hours as directed; do not drive, operate machinery, or drink alcohol with this medication as it may cause drowsiness.    Follow up with your primary care provider if your symptoms are not improving.          ED Prescriptions     Medication Sig Dispense Auth. Provider   predniSONE (STERAPRED UNI-PAK 21 TAB) 10 MG (21) TBPK tablet Take by mouth daily. As directed 21 tablet Mickie Bail, NP      PDMP not reviewed this encounter.   Mickie Bail, NP 10/16/20 1330

## 2021-06-30 ENCOUNTER — Other Ambulatory Visit: Payer: Self-pay
